# Patient Record
Sex: Male | Born: 1970 | Race: Black or African American | Hispanic: No | Marital: Married | Smoking: Never smoker
Health system: Southern US, Community
[De-identification: ages and names within clinical notes are randomized; demographics above are authoritative.]

---

## 2012-07-29 ENCOUNTER — Ambulatory Visit: Payer: Self-pay | Admitting: Urology

## 2020-09-28 ENCOUNTER — Emergency Department
Admission: EM | Admit: 2020-09-28 | Discharge: 2020-09-28 | Disposition: A | Discharge status: Home / Self Care | Payer: Self-pay | Attending: Emergency Medicine | Admitting: Emergency Medicine

## 2020-09-28 ENCOUNTER — Ambulatory Visit
Admission: EM | Admit: 2020-09-28 | Discharge: 2020-09-28 | Disposition: A | Discharge status: Home / Self Care | Payer: Self-pay | Attending: Emergency Medicine | Admitting: Emergency Medicine

## 2020-09-28 ENCOUNTER — Emergency Department: Payer: Self-pay

## 2020-09-28 ENCOUNTER — Other Ambulatory Visit: Payer: Self-pay

## 2020-09-28 DIAGNOSIS — I639 Cerebral infarction, unspecified: Secondary | ICD-10-CM | POA: Insufficient documentation

## 2020-09-28 DIAGNOSIS — Z20822 Contact with and (suspected) exposure to covid-19: Secondary | ICD-10-CM | POA: Insufficient documentation

## 2020-09-28 DIAGNOSIS — R519 Headache, unspecified: Secondary | ICD-10-CM | POA: Insufficient documentation

## 2020-09-28 DIAGNOSIS — I10 Essential (primary) hypertension: Secondary | ICD-10-CM | POA: Insufficient documentation

## 2020-09-28 LAB — CBC
HCT: 40.5 % (ref 39.0–52.0)
Hemoglobin: 13.8 g/dL (ref 13.0–17.0)
MCH: 31 pg (ref 26.0–34.0)
MCHC: 34.1 g/dL (ref 30.0–36.0)
MCV: 91 fL (ref 80.0–100.0)
Platelets: 293 10*3/uL (ref 150–400)
RBC: 4.45 MIL/uL (ref 4.22–5.81)
RDW: 13.4 % (ref 11.5–15.5)
WBC: 6.8 10*3/uL (ref 4.0–10.5)
nRBC: 0 % (ref 0.0–0.2)

## 2020-09-28 LAB — URINALYSIS, COMPLETE (UACMP) WITH MICROSCOPIC
Bacteria, UA: NONE SEEN
Bacteria, UA: NONE SEEN
Bilirubin Urine: NEGATIVE
Bilirubin Urine: NEGATIVE
Glucose, UA: NEGATIVE mg/dL
Glucose, UA: NEGATIVE mg/dL
Ketones, ur: NEGATIVE mg/dL
Ketones, ur: NEGATIVE mg/dL
Leukocytes,Ua: NEGATIVE
Leukocytes,Ua: NEGATIVE
Nitrite: NEGATIVE
Nitrite: NEGATIVE
Protein, ur: NEGATIVE mg/dL
Protein, ur: NEGATIVE mg/dL
Specific Gravity, Urine: 1.025 (ref 1.005–1.030)
Specific Gravity, Urine: 1.024 (ref 1.005–1.030)
Squamous Epithelial / HPF: NONE SEEN (ref 0–5)
Squamous Epithelial / LPF: NONE SEEN (ref 0–5)
WBC, UA: NONE SEEN WBC/hpf (ref 0–5)
pH: 7 (ref 5.0–8.0)
pH: 5 (ref 5.0–8.0)

## 2020-09-28 LAB — BASIC METABOLIC PANEL
Anion gap: 8 (ref 5–15)
BUN: 17 mg/dL (ref 6–20)
CO2: 27 mmol/L (ref 22–32)
Calcium: 8.9 mg/dL (ref 8.9–10.3)
Chloride: 102 mmol/L (ref 98–111)
Creatinine, Ser: 0.69 mg/dL (ref 0.61–1.24)
GFR, Estimated: >60 mL/min (ref >60)
Glucose, Bld: 126 mg/dL — ABNORMAL HIGH (ref 70–99)
Potassium: 3.7 mmol/L (ref 3.5–5.1)
Sodium: 137 mmol/L (ref 135–145)

## 2020-09-28 LAB — HEPATIC FUNCTION PANEL
ALT: 21 U/L (ref 0–44)
AST: 22 U/L (ref 15–41)
Albumin: 4 g/dL (ref 3.5–5.0)
Alkaline Phosphatase: 54 U/L (ref 38–126)
Bilirubin, Direct: <0.1 mg/dL (ref 0.0–0.2)
Total Bilirubin: 0.9 mg/dL (ref 0.3–1.2)
Total Protein: 7.4 g/dL (ref 6.5–8.1)

## 2020-09-28 LAB — RESP PANEL BY RT-PCR (FLU A&B, COVID) ARPGX2
Influenza A by PCR: NEGATIVE
Influenza B by PCR: NEGATIVE
SARS Coronavirus 2 by RT PCR: NEGATIVE

## 2020-09-28 MED ORDER — PROCHLORPERAZINE EDISYLATE 10 MG/2ML IJ SOLN
10.0000 mg | Freq: Once | INTRAMUSCULAR | Status: AC
  Administered 2020-09-28: 10 mg via INTRAVENOUS
  Filled 2020-09-28: qty 2

## 2020-09-28 MED ORDER — SODIUM CHLORIDE 0.9 % IV BOLUS
1000.0000 mL | Freq: Once | INTRAVENOUS | Status: AC
  Administered 2020-09-28: 1000 mL via INTRAVENOUS

## 2020-09-28 NOTE — ED Notes (Signed)
Pt to MRI at this time.

## 2020-09-28 NOTE — Discharge Instructions (Addendum)
Please follow-up with Dr. Malvin Johns or Dr. Sherryll Burger.  They are the neurologists in town.  They can continue to evaluate you for your headache and dizzy spells.  Please return for any worsening especially if you have focal neurological findings like the sensory changes.  You may have had a complicated or complex migraine.  There headaches that come on with nausea and photophobia and occasionally had numbness or weakness associated with them.

## 2020-09-28 NOTE — ED Provider Notes (Signed)
[facility] Emergency Department Provider Note   ____________________________________________   First MD Initiated Contact with Patient 09/28/20 1244     (approximate)  I have reviewed the triage vital signs and the nursing notes.   HISTORY  Chief Complaint Dizziness, Nausea, and Headache    HPI Joshua Alexander is a 49 y.o. male who reports he has had about 3 days of some slight dizziness or unsteadiness feeling.  He also has had a headache around both eyes and the back of his head it feels tight.  He is not run a fever.  He has not had a cough or chills.  He might have a little bit of sinus stuffiness but no sinus tenderness.  He went to urgent care and they noted a facial droop and some difference in sensation from around one side of his body face arm and leg to the other.  The right side he reports feels heavier.  Patient has a little bit of left facial droop but he reports that his wife looked at him and does not see anything different and his driver's license looks the same as his face does now 2.  Patient reports the headache is worse with bright light.  He has had some nausea with all the symptoms as well.  He had something 2 weeks ago that lasted about 3 days and resolved.  This is his third day of this episode.         History reviewed. No pertinent past medical history.  There are no problems to display for this patient.   History reviewed. No pertinent surgical history.  Prior to Admission medications   Not on File    Allergies Patient has no known allergies.  Family History  Problem Relation Age of Onset  . Hyperlipidemia Mother   . Hypertension Mother   . Diabetes Father   . Hyperlipidemia Father   . Hypertension Father     Social History Social History   Tobacco Use  . Smoking status: Never Smoker  . Smokeless tobacco: Never Used  Substance Use Topics  . Alcohol use: Not Currently  . Drug use: Not on file    Review of  Systems  Constitutional: No fever/chills Eyes:  visual changes. ENT: No sore throat. Cardiovascular: Denies chest pain. Respiratory: Denies shortness of breath. Gastrointestinal: No abdominal pain.  No nausea, no vomiting.  No diarrhea.  No constipation. Genitourinary: Negative for dysuria. Musculoskeletal: Negative for back pain. Skin: Negative for rash. Neurological: Negative for focal weakness  ____________________________________________   PHYSICAL EXAM:  VITAL SIGNS: ED Triage Vitals  Enc Vitals Group     BP 09/28/20 1118 131/77     Pulse Rate 09/28/20 1118 72     Resp 09/28/20 1118 16     Temp 09/28/20 1118 98.1 F (36.7 C)     Temp src --      SpO2 09/28/20 1118 98 %     Weight 09/28/20 1120 151 lb (68.5 kg)     Height 09/28/20 1120 5' 6.5" (1.689 m)     Head Circumference --      Peak Flow --      Pain Score 09/28/20 1120 8     Pain Loc --      Pain Edu? --      Excl. in GC? --     Constitutional: Alert and oriented. Well appearing and in no acute distress. Eyes: Conjunctivae are normal. PERRL. EOMI. fundi appear normal bilaterally Head:  Atraumatic. Nose: No congestion/rhinnorhea. Mouth/Throat: Mucous membranes are moist.  Oropharynx non-erythematous. Neck: No stridor.   Cardiovascular: Normal rate, regular rhythm. Grossly normal heart sounds.  Good peripheral circulation. Respiratory: Normal respiratory effort.  No retractions. Lungs CTAB. Gastrointestinal: Soft and nontender. No distention. No abdominal bruits. No CVA tenderness. Musculoskeletal: No lower extremity tenderness nor edema.   Neurologic:  Normal speech and language.  Cranial nerves II through XII are intact of the visual fields were not checked cerebellar finger-to-nose rapid alternating movements and hands are normal toe to my hand is normal motor strength is 5/5 throughout patient does report that the right side of his body feels heavier than the left side when I check light touch.  There is  applies to the face arm and leg. Skin:  Skin is warm, dry and intact. No rash noted. Psychiatric: Mood and affect are normal. Speech and behavior are normal.  ____________________________________________   LABS (all labs ordered are listed, but only abnormal results are displayed)  Labs Reviewed  BASIC METABOLIC PANEL - Abnormal; Notable for the following components:      Result Value   Glucose, Bld 126 (*)    All other components within normal limits  URINALYSIS, COMPLETE (UACMP) WITH MICROSCOPIC - Abnormal; Notable for the following components:   Color, Urine YELLOW (*)    APPearance CLEAR (*)    Hgb urine dipstick MODERATE (*)    All other components within normal limits  RESP PANEL BY RT-PCR (FLU A&B, COVID) ARPGX2  CBC  HEPATIC FUNCTION PANEL  CBG MONITORING, ED   ____________________________________________  EKG  EKG read interpreted by me shows normal sinus rhythm rate of 80 normal axis no acute ST-T wave changes.  Computer is same consider left ventricular hypertrophy. ____________________________________________  RADIOLOGY Jill Poling, personally viewed and evaluated these images (plain radiographs) as part of my medical decision making, as well as reviewing the written report by the radiologist.  ED MD interpretation:    Official radiology report(s): CT Head Wo Contrast  Result Date: 09/28/2020 CLINICAL DATA:  Dizziness, nonspecific; headache, classic migraine; headache for 3 days a second episode behind the eyes behind the back of the head with some dizziness. EXAM: CT HEAD WITHOUT CONTRAST TECHNIQUE: Contiguous axial images were obtained from the base of the skull through the vertex without intravenous contrast. COMPARISON:  No pertinent prior exams are available for comparison. FINDINGS: Brain: Cerebral volume is normal. There is no acute intracranial hemorrhage. No demarcated cortical infarct. No extra-axial fluid collection. No evidence of intracranial  mass. No midline shift. Vascular: No hyperdense vessel. Skull: No calvarial fracture. Benign appearing 13 mm well-circumscribed low-density focus within the right sphenoid bone, posterior to the right sphenoid sinus. Sinuses/Orbits: Visualized orbits show no acute finding. Extensive partial opacification of the bilateral ethmoid air cells. Frothy secretions within the right frontal and maxillary sinuses. IMPRESSION: No evidence of acute intracranial abnormality. Ethmoid, right frontal and right maxillary sinusitis. Electronically Signed   By: Jackey Loge DO   On: 09/28/2020 13:52   MR BRAIN WO CONTRAST  Result Date: 09/28/2020 CLINICAL DATA:  Dizziness EXAM: MRI HEAD WITHOUT CONTRAST TECHNIQUE: Multiplanar, multiecho pulse sequences of the brain and surrounding structures were obtained without intravenous contrast. COMPARISON:  None. FINDINGS: Brain: There is no acute infarction or intracranial hemorrhage. There is no intracranial mass, mass effect, or edema. There is no hydrocephalus or extra-axial fluid collection. Ventricles and sulci are normal in size and configuration. Vascular: Major vessel flow voids at  the skull base are preserved. Skull and upper cervical spine: Normal marrow signal is preserved. Sinuses/Orbits: Paranasal sinus inflammatory changes, greatest in the ethmoids. Orbits are unremarkable. Other: Sella is unremarkable.  Mastoid air cells are clear. IMPRESSION: No evidence of recent infarction, hemorrhage, or mass. Nonspecific paranasal sinus inflammatory changes. Electronically Signed   By: Guadlupe Spanish M.D.   On: 09/28/2020 16:37   DG Chest Portable 1 View  Result Date: 09/28/2020 CLINICAL DATA:  Possible stroke like symptoms. History diabetes, hypertension. EXAM: PORTABLE CHEST 1 VIEW COMPARISON:  No pertinent prior exams are available for comparison. FINDINGS: Heart size within normal limits. No appreciable airspace consolidation or pulmonary edema. No evidence of pleural effusion  or pneumothorax. No acute bony abnormality is identified. IMPRESSION: No evidence of acute cardiopulmonary abnormality. Electronically Signed   By: Jackey Loge DO   On: 09/28/2020 13:53    ____________________________________________   PROCEDURES  Procedure(s) performed (including Critical Care):  Procedures   ____________________________________________   INITIAL IMPRESSION / ASSESSMENT AND PLAN / ED COURSE  Patient with negative CT negative MRI symptoms of numbness or different touch on the right side resolved with Compazine and fluids.  Patient's headache improved.  Patient did have some fluid in his sinuses but no fever no real sinus tenderness and no white count.  This may be due to a viral infection.  Again patient's headaches and all of his symptoms resolved completely.  He wants to go home.  I will let him do so.  I did tell him about the sinus infection.  He will follow up with neurology for possible complex migraine.  With symptoms for about 3 days and nothing on MRI etc. and resolution with Compazine and fluids it is unlikely that this was a TIA/CVA.  After several more days if his headache returns we could treat his sinusitis with antibiotics.  We can also do that if he develops a fever but currently I would not as he does not meet the criteria at this point.             ____________________________________________   FINAL CLINICAL IMPRESSION(S) / ED DIAGNOSES  Final diagnoses:  Nonintractable headache, unspecified chronicity pattern, unspecified headache type     ED Discharge Orders    None      *Please note:  Joshua Alexander was evaluated in Emergency Department on 09/28/2020 for the symptoms described in the history of present illness. He was evaluated in the context of the global COVID-19 pandemic, which necessitated consideration that the patient might be at risk for infection with the SARS-CoV-2 virus that causes COVID-19. Institutional protocols and  algorithms that pertain to the evaluation of patients at risk for COVID-19 are in a state of rapid change based on information released by regulatory bodies including the CDC and federal and state organizations. These policies and algorithms were followed during the patient's care in the ED.  Some ED evaluations and interventions may be delayed as a result of limited staffing during and the pandemic.*   Note:  This document was prepared using Dragon voice recognition software and may include unintentional dictation errors.    Arnaldo Natal, MD 09/28/20 2050

## 2020-09-28 NOTE — ED Notes (Signed)
Pt verbalized understanding of d/c instructions at this time. Pt denies questions at this time

## 2020-09-28 NOTE — Discharge Instructions (Addendum)
As per our conversation with your new headache, dizziness, and elevated blood pressure combined with your sensory changes to the left side of your face I am recommending that she go to the ER for evaluation.

## 2020-09-28 NOTE — ED Notes (Addendum)
This RN to bedside at this time. Pt states he was seen at a clinic today, and they said "my BP was through the roof" Pt also states the staff at the clinic noticed a slight facial droop and told him he needed a CT scan.   NIH completed by this RN, pt scored 0 at this time.   Pt NAD, A&Ox 4 at this time.

## 2020-09-28 NOTE — ED Triage Notes (Signed)
Pt is here with a migraine and dizziness with nausea that started 3 days ago, pt has taken Tylenol and Advil to relieve discomfort.

## 2020-09-28 NOTE — ED Triage Notes (Signed)
Pt arrives via pov. C/o headache, dizziness, nausea w/o emesis, sensitivity to light x 3 days. Pt reports having similar occurrence 2 weeks ago with same symptoms that resolved after 3 day. Pt ambulatory to triage with steady gate, a&o x4, NAD noted at this time. Pupils equal round reactive.

## 2020-09-28 NOTE — ED Provider Notes (Signed)
[facility]    CSN: [id number] Arrival date & time: 09/28/20  0909      History   Chief Complaint Chief Complaint  Patient presents with  . Dizziness  . Migraine  . Nausea    HPI Joshua Alexander is a 49 y.o. male.   HPI   49 year old male here for evaluation of headache, dizziness, and nausea.  Patient reports that his symptoms started 3 days ago.  His headache is a tight band around his forehead and to the back base of his skull.  Patient states that he had similar symptoms 2 weeks ago.  He states that he woke up and when he went to get out of bed he immediately fell due to being dizzy.  He states he had a headache at that time, he was flushed, and it took 3 days to resolve.  Patient states this time that his headache started 3 days ago but the dizziness started yesterday while he was on the job site.  He states he was climbing a ladder and fell off due to dizziness.  Patient denies hitting his head or loss of consciousness.  Patient denies flashing lights or spots, numbness, tingling, weakness.  Patient has a history of vertigo but states that this is not like his vertigo it is more of a feeling of being off balance.  Patient is also light sensitive and noise sensitive.  Patient denies ringing in his ears.  Patient states that he has not had any visual changes except for he notices that he has to use his reading glasses more often.  When he does uses reading glasses his headaches and his eyestrain ease up.  Patient has never seen an eye doctor.  History reviewed. No pertinent past medical history.  There are no problems to display for this patient.   History reviewed. No pertinent surgical history.     Home Medications    Prior to Admission medications   Not on File    Family History Family History  Problem Relation Age of Onset  . Hyperlipidemia Mother   . Hypertension Mother   . Diabetes Father   . Hyperlipidemia Father   . Hypertension Father      Social History Social History   Tobacco Use  . Smoking status: Never Smoker  . Smokeless tobacco: Never Used  Substance Use Topics  . Alcohol use: Not Currently  . Drug use: Not on file     Allergies   Patient has no known allergies.   Review of Systems Review of Systems  Constitutional: Negative for appetite change and fever.  HENT: Negative for congestion, rhinorrhea and sore throat.   Respiratory: Negative for cough, shortness of breath and wheezing.   Cardiovascular: Negative for chest pain.  Gastrointestinal: Positive for nausea.  Musculoskeletal: Negative for arthralgias and myalgias.  Skin: Negative for rash.  Neurological: Positive for dizziness and headaches. Negative for syncope, speech difficulty, weakness and numbness.  Hematological: Negative.   Psychiatric/Behavioral: Negative.      Physical Exam Triage Vital Signs ED Triage Vitals  Enc Vitals Group     BP 09/28/20 0930 (!) 142/107     Pulse Rate 09/28/20 0930 81     Resp 09/28/20 0930 18     Temp 09/28/20 0930 98.1 F (36.7 C)     Temp Source 09/28/20 0930 Oral     SpO2 09/28/20 0930 98 %     Weight --      Height --  Head Circumference --      Peak Flow --      Pain Score 09/28/20 0928 6     Pain Loc --      Pain Edu? --      Excl. in GC? --    No data found.  Updated Vital Signs BP (!) 142/107 (BP Location: Left Arm)   Pulse 81   Temp 98.1 F (36.7 C) (Oral)   Resp 18   SpO2 98%   Visual Acuity Right Eye Distance:   Left Eye Distance:   Bilateral Distance:    Right Eye Near:   Left Eye Near:    Bilateral Near:     Physical Exam Vitals and nursing note reviewed.  Constitutional:      Appearance: He is normal weight. He is not toxic-appearing or diaphoretic.  HENT:     Head: Normocephalic.     Right Ear: Tympanic membrane, ear canal and external ear normal. There is no impacted cerumen.     Left Ear: Tympanic membrane, ear canal and external ear normal. There is no  impacted cerumen.  Eyes:     General: No scleral icterus.    Conjunctiva/sclera: Conjunctivae normal.     Pupils: Pupils are equal, round, and reactive to light.  Neck:     Vascular: No carotid bruit.  Cardiovascular:     Rate and Rhythm: Normal rate and regular rhythm.     Pulses: Normal pulses.     Heart sounds: Normal heart sounds. No murmur heard.  No gallop.   Pulmonary:     Effort: Pulmonary effort is normal.     Breath sounds: Normal breath sounds. No wheezing, rhonchi or rales.  Musculoskeletal:        General: No swelling or tenderness. Normal range of motion.     Cervical back: Normal range of motion and neck supple. No tenderness.  Skin:    General: Skin is warm and dry.     Capillary Refill: Capillary refill takes less than 2 seconds.     Findings: No erythema.  Neurological:     Mental Status: He is alert and oriented to person, place, and time.     Sensory: Sensory deficit present.     Motor: No weakness.     Coordination: Coordination normal.     Deep Tendon Reflexes: Reflexes normal.  Psychiatric:        Mood and Affect: Mood normal.        Behavior: Behavior normal.        Thought Content: Thought content normal.        Judgment: Judgment normal.      UC Treatments / Results  Labs (all labs ordered are listed, but only abnormal results are displayed) Labs Reviewed  URINALYSIS, COMPLETE (UACMP) WITH MICROSCOPIC - Abnormal; Notable for the following components:      Result Value   APPearance HAZY (*)    Hgb urine dipstick SMALL (*)    All other components within normal limits    EKG   Radiology No results found.  Procedures Procedures (including critical care time)  Medications Ordered in UC Medications - No data to display  Initial Impression / Assessment and Plan / UC Course  I have reviewed the triage vital signs and the nursing notes.  Pertinent labs & imaging results that were available during my care of the patient were reviewed by  me and considered in my medical decision making (see chart for details).   Patient  is here for evaluation of headache with associated dizziness and nausea x3 days. Patient has had 2 similar episodes back to back in the past 2 weeks. Patient has a history of vertigo in the past but this is unlike his previous vertigo symptoms. Patient also has no documented history of hypertension but he is hypertensive at 142/107 here. On exam patient has sensory deficits to the left side of his face and questionable asymmetry that was mild on the left side of his face. Given the setting of new onset headache, dizziness, sensory deficits and advising patient to go to the ER for evaluation CT scan as we are unable to perform CT scan here today. 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 She has no smoking, alcohol use, or drug use history. She does have a family history of hypertension.   Final Clinical Impressions(s) / UC Diagnoses   Final diagnoses:  Acute nonintractable headache, unspecified headache type  Hypertension, unspecified type     Discharge Instructions     As per our conversation with your new headache, dizziness, and elevated blood pressure combined with your sensory changes to the left side of your face I am recommending that she go to the ER for evaluation.    ED Prescriptions    None     PDMP not reviewed this encounter.   Becky Augusta, NP 09/28/20 1023

## 2022-06-02 IMAGING — CT CT HEAD W/O CM
3 series · 16 of 47 positions shown, 19 images · non-contrast
Comparison: No pertinent prior exams are available for comparison.

CLINICAL DATA: Dizziness, nonspecific; headache, classic migraine;
headache for 3 days a second episode behind the eyes behind the back
of the head with some dizziness.

EXAM:
CT HEAD WITHOUT CONTRAST
TECHNIQUE: Contiguous axial images were obtained from the base of the skull
through the vertex without intravenous contrast.

[Series 2: head wo · axial · 0.44mm/px · z∈[-153,-23]mm · 10 of 32 slices shown, 13 images]
[im 3/32  brain]
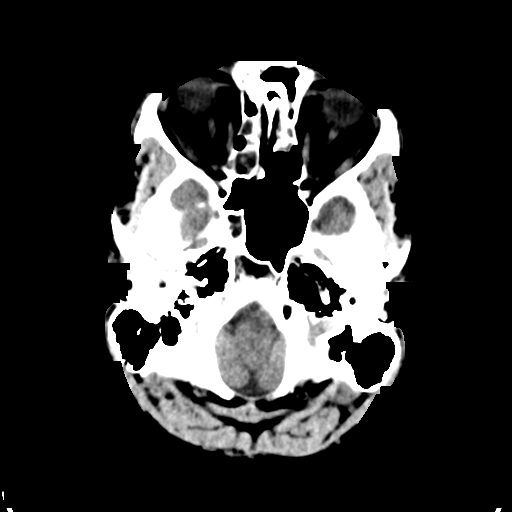
[im 3/32  bone]
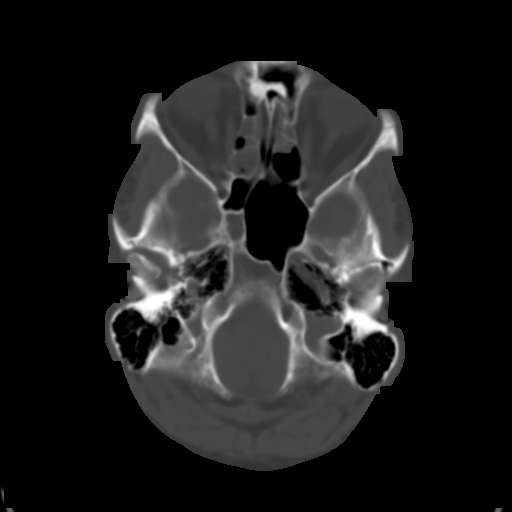
[im 6/32  brain]
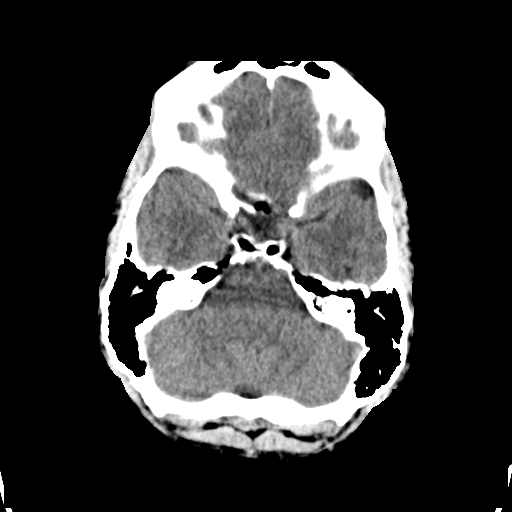
[im 9/32  brain]
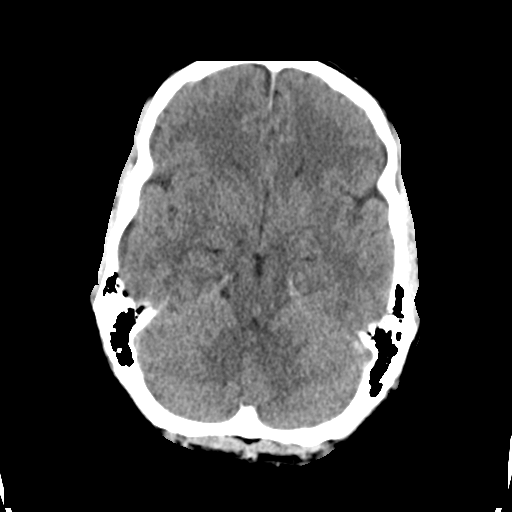
[im 11/32  brain]
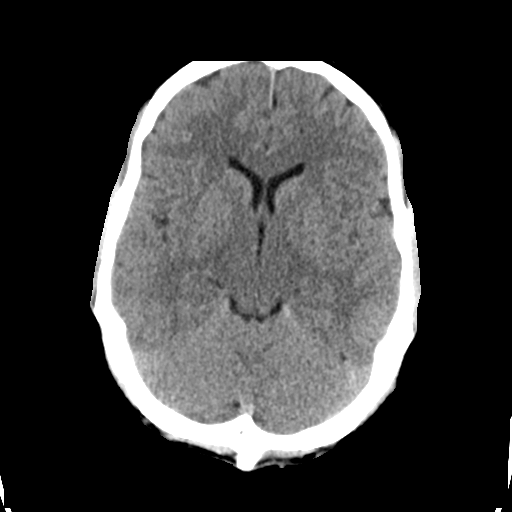
[im 14/32  brain]
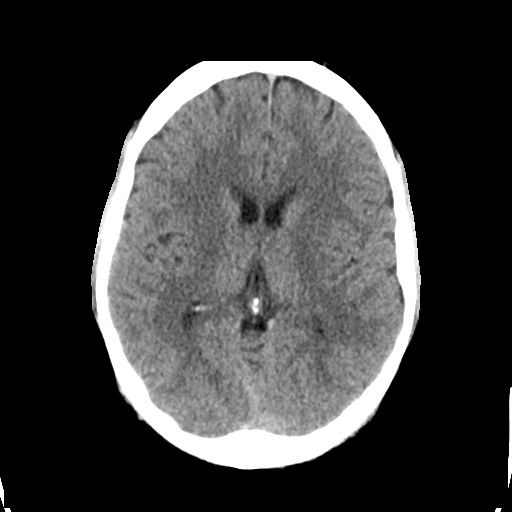
[im 14/32  bone]
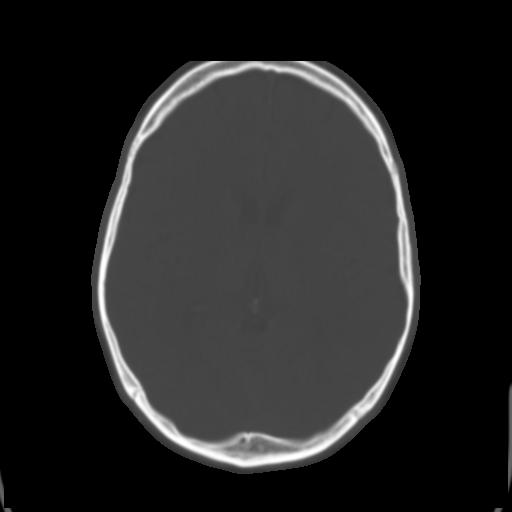
[im 18/32  brain]
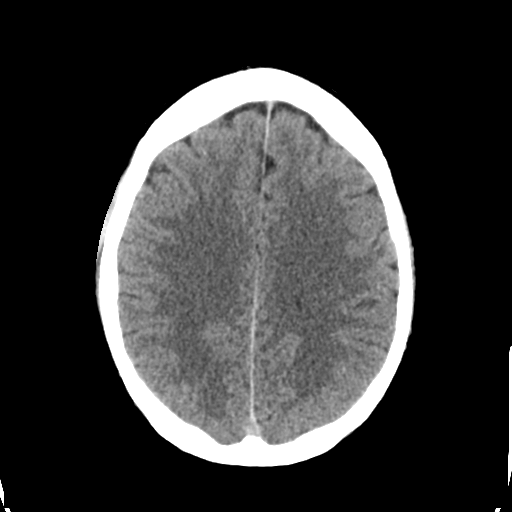
[im 21/32  brain]
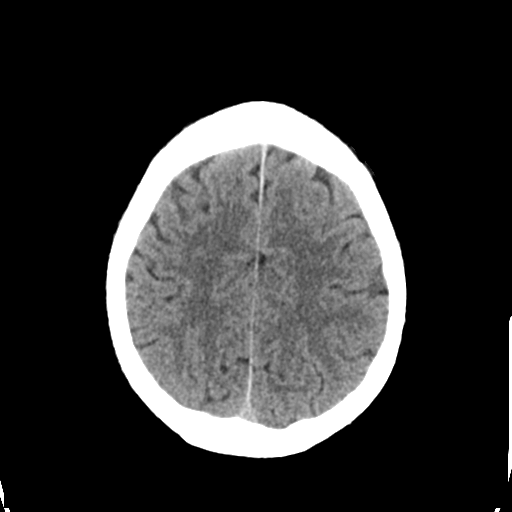
[im 24/32  brain]
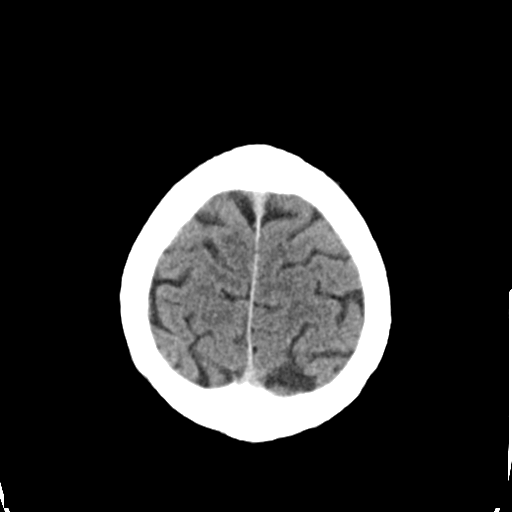
[im 26/32  brain]
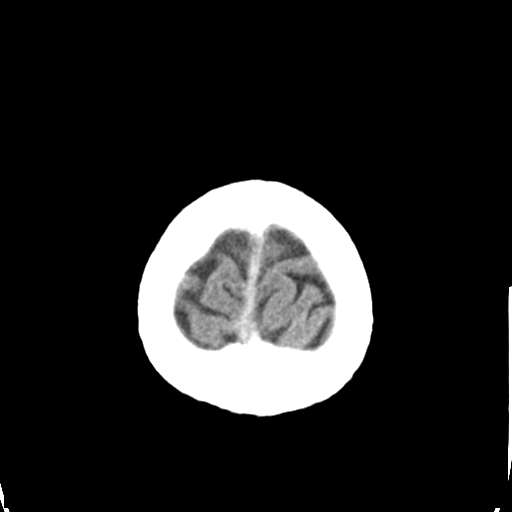
[im 26/32  bone]
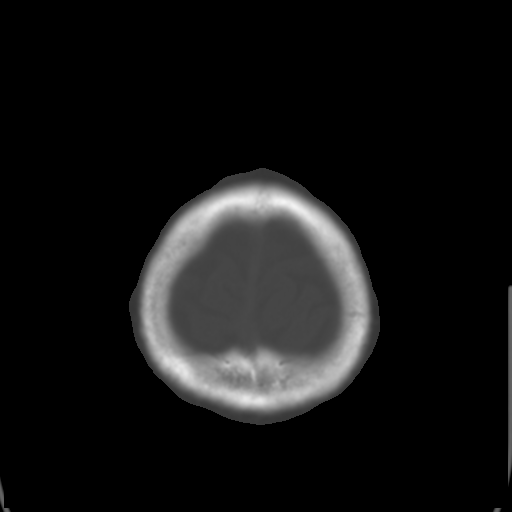
[im 29/32  brain]
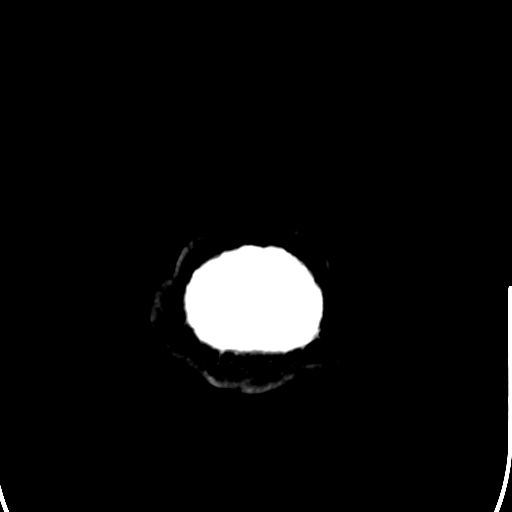

[Series 4: coronal soft tissue · coronal · 0.33mm/px · 3 of 67 slices shown]
[im 23/67  brain]
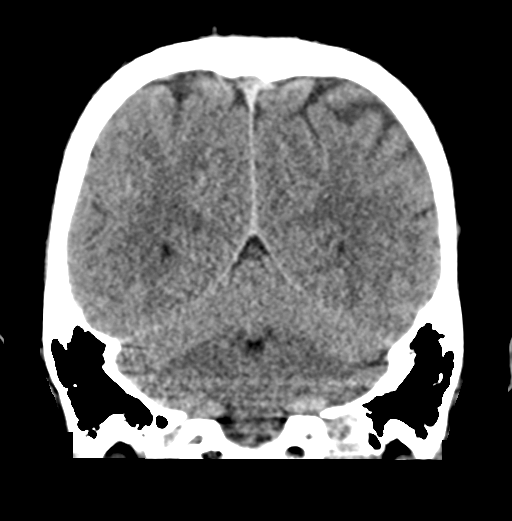
[im 30/67  brain]
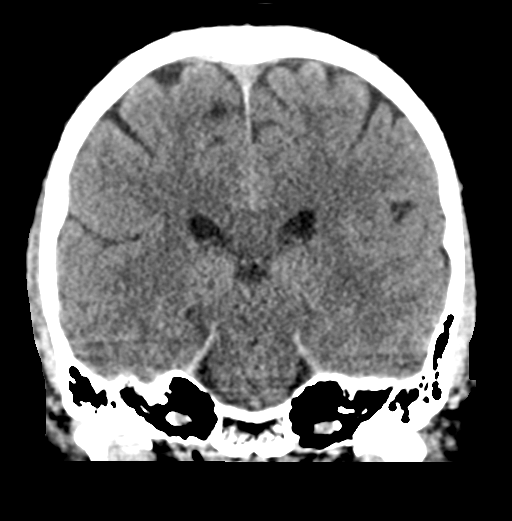
[im 37/67  brain]
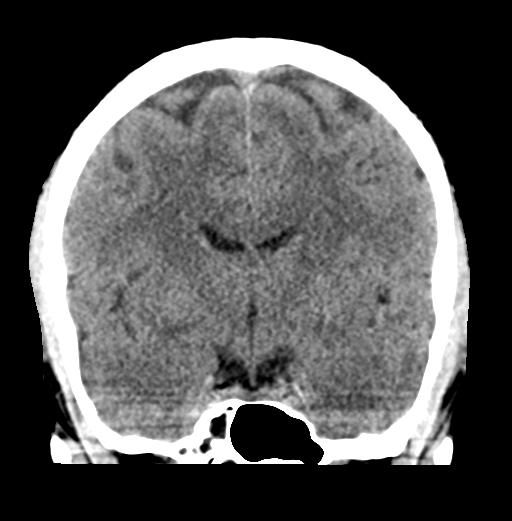

[Series 5: sagittal soft tissue · sagittal · 0.33mm/px · 3 of 57 slices shown]
[im 19/57  brain]
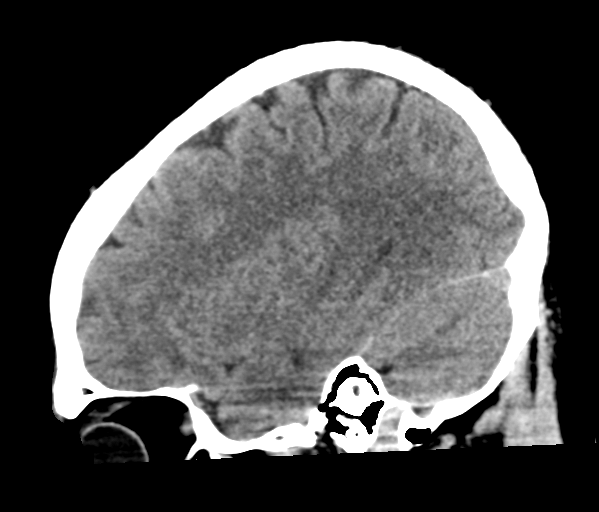
[im 29/57  brain]
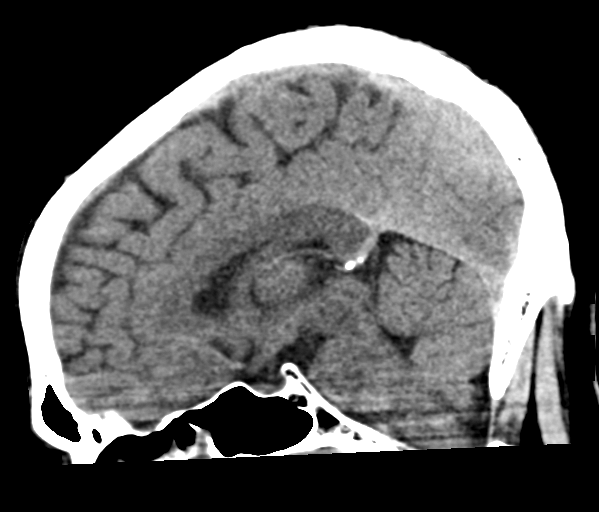
[im 38/57  brain]
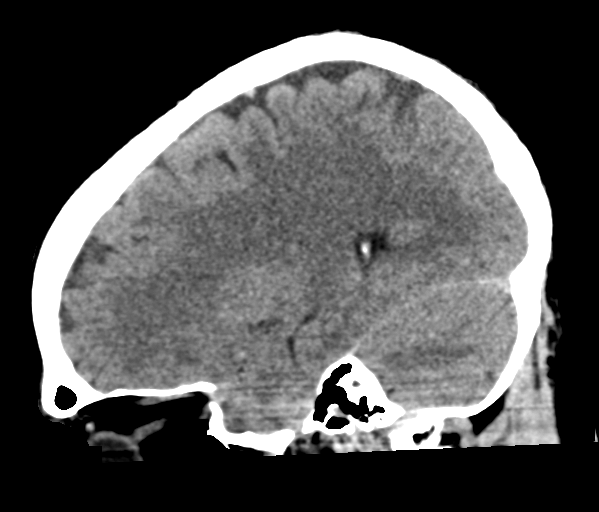

[16 of 47 positions shown; findings below may reference images not displayed]

FINDINGS: Brain:

Cerebral volume is normal.

There is no acute intracranial hemorrhage.

No demarcated cortical infarct.

No extra-axial fluid collection.

No evidence of intracranial mass.

No midline shift.

Vascular: No hyperdense vessel.

Skull: No calvarial fracture. Benign appearing 13 mm
well-circumscribed low-density focus within the right sphenoid bone,
posterior to the right sphenoid sinus.

Sinuses/Orbits: Visualized orbits show no acute finding. Extensive
partial opacification of the bilateral ethmoid air cells. Frothy
secretions within the right frontal and maxillary sinuses.
IMPRESSION: No evidence of acute intracranial abnormality.

Ethmoid, right frontal and right maxillary sinusitis.

## 2023-11-28 ENCOUNTER — Ambulatory Visit
Admission: EM | Admit: 2023-11-28 | Discharge: 2023-11-28 | Disposition: A | Discharge status: Home / Self Care | Payer: Self-pay | Attending: Physician Assistant | Admitting: Physician Assistant

## 2023-11-28 ENCOUNTER — Ambulatory Visit (INDEPENDENT_AMBULATORY_CARE_PROVIDER_SITE_OTHER): Payer: Self-pay

## 2023-11-28 DIAGNOSIS — M7989 Other specified soft tissue disorders: Secondary | ICD-10-CM

## 2023-11-28 DIAGNOSIS — M79602 Pain in left arm: Secondary | ICD-10-CM

## 2023-11-28 DIAGNOSIS — S63502A Unspecified sprain of left wrist, initial encounter: Secondary | ICD-10-CM

## 2023-11-28 DIAGNOSIS — S60459A Superficial foreign body of unspecified finger, initial encounter: Secondary | ICD-10-CM

## 2023-11-28 DIAGNOSIS — S5012XA Contusion of left forearm, initial encounter: Secondary | ICD-10-CM

## 2023-11-28 DIAGNOSIS — M25532 Pain in left wrist: Secondary | ICD-10-CM

## 2023-11-28 NOTE — Discharge Instructions (Addendum)
-  Negative for fractures.  You do have a splinter in your finger so soak the finger in warm Epsom salts a couple times a day and gently press on the area.  Your body will likely push the splinter out.  If you notice any signs of infection return for reevaluation. - You have a large hematoma of your forearm.  Ice this area every couple of hours and elevate it. - Use the sling and brace as needed for comfort. - Take ibuprofen and or Tylenol for pain relief. - Avoid painful activities.

## 2023-11-28 NOTE — ED Provider Notes (Signed)
[facility]    CSN: [id number] Arrival date & time: 11/28/23  0803      History   Chief Complaint Chief Complaint  Patient presents with   Arm Pain         HPI Joshua Alexander is a 53 y.o. male who is right-hand dominant.  He presents today for a left upper extremity injury after slipping and falling on ice while unloading a truck.  States that he stuck his hand out to try to catch himself.  Patient states he has most pain and swelling of the forearm but also has pain in the wrist and parts of his hand.  Reports difficulty making a fist due to pain and discomfort.  He has applied ice and taken a gram of Tylenol last night.  Denies numbness, weakness or tingling.  No pain in his shoulder or elbow.  Denies head injury or loss conscious.  No other injuries to report.  HPI  History reviewed. No pertinent past medical history.  There are no active problems to display for this patient.   History reviewed. No pertinent surgical history.     Home Medications    Prior to Admission medications   Not on File    Family History Family History  Problem Relation Age of Onset   Hyperlipidemia Mother    Hypertension Mother    Diabetes Father    Hyperlipidemia Father    Hypertension Father     Social History Social History   Tobacco Use   Smoking status: Never   Smokeless tobacco: Never  Vaping Use   Vaping status: Never Used  Substance Use Topics   Alcohol use: Not Currently   Drug use: Never     Allergies   Patient has no known allergies.   Review of Systems Review of Systems  Musculoskeletal:  Positive for arthralgias. Negative for joint swelling, neck pain and neck stiffness.  Skin:  Negative for color change and wound.  Neurological:  Negative for syncope, weakness, numbness and headaches.     Physical Exam Triage Vital Signs ED Triage Vitals  Encounter Vitals Group     BP      Systolic BP Percentile      Diastolic BP Percentile       Pulse      Resp      Temp      Temp src      SpO2      Weight      Height      Head Circumference      Peak Flow      Pain Score      Pain Loc      Pain Education      Exclude from Growth Chart    No data found.  Updated Vital Signs BP 131/74 (BP Location: Left Arm)   Pulse 64   Temp 98.7 F (37.1 C) (Oral)   Ht 5\' 7"  (1.702 m)   Wt 162 lb (73.5 kg)   SpO2 96%   BMI 25.37 kg/m    Physical Exam Vitals and nursing note reviewed.  Constitutional:      General: He is not in acute distress.    Appearance: Normal appearance. He is well-developed. He is not ill-appearing.  HENT:     Head: Normocephalic and atraumatic.  Eyes:     General: No scleral icterus.    Conjunctiva/sclera: Conjunctivae normal.  Cardiovascular:     Rate and Rhythm: Normal rate and regular  rhythm.  Pulmonary:     Effort: Pulmonary effort is normal. No respiratory distress.     Breath sounds: Normal breath sounds.  Musculoskeletal:     Cervical back: Neck supple.     Comments: LEFT UPPER EXT: Hematoma/swelling present over forearm dorsally.  Tenderness diffusely throughout wrist and forearm and reduced range of motion of wrist.  No tenderness of elbow and full range of motion of elbow.  Tenderness at base of thumb and about the fourth metacarpal.  No swelling or bruising noted of hand.  No wounds.  Good pulses.  Skin:    General: Skin is warm and dry.     Capillary Refill: Capillary refill takes less than 2 seconds.  Neurological:     General: No focal deficit present.     Mental Status: He is alert. Mental status is at baseline.     Motor: No weakness.     Gait: Gait normal.  Psychiatric:        Mood and Affect: Mood normal.        Behavior: Behavior normal.      UC Treatments / Results  Labs (all labs ordered are listed, but only abnormal results are displayed) Labs Reviewed - No data to display  EKG   Radiology DG Forearm Left Result Date: 11/28/2023 CLINICAL DATA:  53 year old  male with history of trauma from a fall. Left hand and arm pain. EXAM: LEFT FOREARM - 2 VIEW COMPARISON:  No priors. FINDINGS: There is no evidence of fracture or other focal bone lesions. Soft tissues are unremarkable. IMPRESSION: Negative. Electronically Signed   By: Trudie Reed M.D.   On: 11/28/2023 08:44   DG Hand Complete Left Result Date: 11/28/2023 CLINICAL DATA:  53 year old male with history of trauma from a fall on ice. Pain in the left hand. EXAM: LEFT HAND - COMPLETE 3+ VIEW COMPARISON:  No priors. FINDINGS: Three views of the left hand demonstrate no acute displaced fracture, subluxation or dislocation. Small radiopaque linear foreign body in the soft tissues of the volar aspect of the fourth digit adjacent to the middle phalanx. Extensive soft tissue swelling adjacent to the medial aspect of the middle phalanx of the third digit (no prior studies are available for comparison). IMPRESSION: 1. No acute displaced fracture or dislocation. 2. Small radiopaque linear foreign body in the soft tissues of the volar aspect of the fourth digit adjacent to the middle phalanx. 3. Extensive soft tissue swelling adjacent to the medial aspect of the middle phalanx of the third digit. Electronically Signed   By: Trudie Reed M.D.   On: 11/28/2023 08:44    Procedures Procedures (including critical care time)  Medications Ordered in UC Medications - No data to display  Initial Impression / Assessment and Plan / UC Course  I have reviewed the triage vital signs and the nursing notes.  Pertinent labs & imaging results that were available during my care of the patient were reviewed by me and considered in my medical decision making (see chart for details).    53 year old male presents for pain of the left forearm and hand following accidental slip and fall on ice.  Reports icing the area and taking Tylenol.  Exam findings--LEFT UPPER EXT: Hematoma/swelling present over forearm dorsally.   Tenderness diffusely throughout wrist and forearm and reduced range of motion of wrist.  No tenderness of elbow and full range of motion of elbow.  Tenderness at base of thumb and about the fourth metacarpal.  No swelling  or bruising noted of hand.  No wounds.  Good pulses.  X-rays obtained of left hand and forearm to assess for potential fracture.  X-rays are all negative for fracture but he does have a linear foreign body of the fourth finger.  Patient reports sustaining a splinter a couple weeks ago.  Able to see a dark spot under the skin which appears to be consistent with splinter.  Patient given thumb spica wrist brace and sling for comfort.  Advised ibuprofen and Tylenol, cryotherapy.  Discussed care of skin foreign body.  Encouraged him to use warm Epsom salt soaks and gently press on the area as his body will likely push the splinter out.  Advised to return for any signs of infection.   Final Clinical Impressions(s) / UC Diagnoses   Final diagnoses:  Pain and swelling of left upper extremity  Hematoma of left forearm  Left wrist pain  Splinter of finger  Sprain of left wrist, initial encounter     Discharge Instructions      -Negative for fractures.  You do have a splinter in your finger so soak the finger in warm Epsom salts a couple times a day and gently press on the area.  Your body will likely push the splinter out.  If you notice any signs of infection return for reevaluation. - You have a large hematoma of your forearm.  Ice this area every couple of hours and elevate it. - Use the sling and brace as needed for comfort. - Take ibuprofen and or Tylenol for pain relief. - Avoid painful activities.     ED Prescriptions   None    PDMP not reviewed this encounter.   Shirlee Latch, PA-C 11/28/23 [id number]

## 2023-11-28 NOTE — ED Triage Notes (Signed)
Pt c/o left arm injury x1day  Pt states that he was unloading a work truck and slipped onto ice and landed on his left arm.   Pt states that he was trying to catch himself and landed on his wrist.   Pt has swelling in his mid forearm and states that the pain is from mid forearm down to wrist.  Pt states that he can not make a fist  Pt denies pain in elbow, hand, or fingers.

## 2024-09-05 ENCOUNTER — Encounter: Payer: Self-pay | Admitting: Emergency Medicine

## 2024-09-05 ENCOUNTER — Ambulatory Visit
Admission: EM | Admit: 2024-09-05 | Discharge: 2024-09-05 | Disposition: A | Discharge status: Home / Self Care | Payer: Self-pay | Attending: Emergency Medicine | Admitting: Emergency Medicine

## 2024-09-05 DIAGNOSIS — M7631 Iliotibial band syndrome, right leg: Secondary | ICD-10-CM

## 2024-09-05 MED ORDER — KETOROLAC TROMETHAMINE 30 MG/ML IJ SOLN
30.0000 mg | Freq: Once | INTRAMUSCULAR | Status: AC
  Administered 2024-09-05: 30 mg via INTRAMUSCULAR

## 2024-09-05 MED ORDER — IBUPROFEN 600 MG PO TABS: 600.0000 mg | ORAL_TABLET | Freq: Four times a day (QID) | ORAL | 0 refills | Status: AC | PRN

## 2024-09-05 NOTE — ED Triage Notes (Signed)
 Pt presents with right hip pain that radiates down his leg x 4 days The pain started after a bump appeared on his right thigh and right ankle. Pt is taking tylenol for the pain.

## 2024-09-05 NOTE — ED Provider Notes (Signed)
[facility]    CSN: [id number] Arrival date & time: 09/05/24  1307      History   Chief Complaint Chief Complaint  Patient presents with   Hip Pain    HPI Joshua Alexander is a 53 y.o. male.   HPI  53 year old male with no significant past medical history presents for evaluation of pain in his right leg that extends from his hip down to his knee and occasionally will radiate down the outside of his lower leg to his ankle.  No injury or falls.  He has been using Tylenol with minimal relief of pain.  History reviewed. No pertinent past medical history.  There are no active problems to display for this patient.   History reviewed. No pertinent surgical history.     Home Medications    Prior to Admission medications   Medication Sig Start Date End Date Taking? Authorizing Provider  ibuprofen (ADVIL) 600 MG tablet Take 1 tablet (600 mg total) by mouth every 6 (six) hours as needed. 09/05/24  Yes Bernardino Ditch, NP    Family History Family History  Problem Relation Age of Onset   Hyperlipidemia Mother    Hypertension Mother    Diabetes Father    Hyperlipidemia Father    Hypertension Father     Social History Social History   Tobacco Use   Smoking status: Never   Smokeless tobacco: Never  Vaping Use   Vaping status: Never Used  Substance Use Topics   Alcohol use: Not Currently   Drug use: Never     Allergies   Patient has no known allergies.   Review of Systems Review of Systems  Musculoskeletal:  Positive for myalgias. Negative for arthralgias and joint swelling.  Skin:  Negative for color change and wound.     Physical Exam Triage Vital Signs ED Triage Vitals  Encounter Vitals Group     BP      Girls Systolic BP Percentile      Girls Diastolic BP Percentile      Boys Systolic BP Percentile      Boys Diastolic BP Percentile      Pulse      Resp      Temp      Temp src      SpO2      Weight      Height      Head  Circumference      Peak Flow      Pain Score      Pain Loc      Pain Education      Exclude from Growth Chart    No data found.  Updated Vital Signs BP 135/73 (BP Location: Right Arm)   Pulse 78   Temp 98.8 F (37.1 C) (Oral)   Resp 16   Wt 155 lb (70.3 kg)   SpO2 95%   BMI 24.28 kg/m   Visual Acuity Right Eye Distance:   Left Eye Distance:   Bilateral Distance:    Right Eye Near:   Left Eye Near:    Bilateral Near:     Physical Exam Vitals and nursing note reviewed.  Constitutional:      Appearance: Normal appearance. He is not ill-appearing.  HENT:     Head: Normocephalic and atraumatic.  Musculoskeletal:        General: Tenderness present. No swelling or signs of injury.  Skin:    General: Skin is warm and dry.  Capillary Refill: Capillary refill takes less than 2 seconds.     Findings: Lesion present. No erythema.  Neurological:     General: No focal deficit present.     Mental Status: He is alert and oriented to person, place, and time.      UC Treatments / Results  Labs (all labs ordered are listed, but only abnormal results are displayed) Labs Reviewed - No data to display  EKG   Radiology No results found.  Procedures Procedures (including critical care time)  Medications Ordered in UC Medications  ketorolac (TORADOL) 30 MG/ML injection 30 mg (has no administration in time range)    Initial Impression / Assessment and Plan / UC Course  I have reviewed the triage vital signs and the nursing notes.  Pertinent labs & imaging results that were available during my care of the patient were reviewed by me and considered in my medical decision making (see chart for details).   Patient is a nontoxic-appearing 53 year old male presenting for evaluation of pain in the right leg as outlined in the HPI above.  He reports that he noticed 2 small lesions, 1 on the inside of his right ankle, the other on the posterior aspect of his right thigh, just  before the onset of pain.  No injury.    Patient seen and developed, the lesion a pop on the back of the patient's right thigh is actually a mole.  The other lesion on the inside ankle appears to be a resolving ingrown hair or pimple of some kind.  I do not think that these are causing the patient's pain.  He does have pain with palpation of his IT band.  His symptoms are most consistent with IT band syndrome.  He has been using Tylenol without any improvement of symptoms.  I will have staff administer 30 mg of IM Toradol in clinic prior to discharge and then discharge him home on 600 mg ibuprofen that he can take every 6 hours.  I will also give him home physical therapy exercises to perform.  I have advised the patient that if he does not have any improvement of his symptoms in the next 5 to 7 days that he should follow-up with orthopedics.    Final Clinical Impressions(s) / UC Diagnoses   Final diagnoses:  It band syndrome, right     Discharge Instructions      Rest your right leg is much as possible.  Avoid activities that trigger the pain.  If you exercise routinely I suggest reducing your intensity of training.  Apply ice to the affected area for 10 to 15 minutes, 2-3 times a day to help decrease pain and inflammation.  Ice for the first 48 hours and then follow icing by moist heat.  Take the 600 mg of ibuprofen every 6 hours with food to help with pain and inflammation.  Follow the home physical therapy exercises provided in your discharge instructions.  If you do not have any improvement of your symptoms in the next 5 to 7 days I recommend that you follow-up with orthopedics as you may need dedicated physical therapy or other therapeutic interventions.     ED Prescriptions     Medication Sig Dispense Auth. Provider   ibuprofen (ADVIL) 600 MG tablet Take 1 tablet (600 mg total) by mouth every 6 (six) hours as needed. 30 tablet Bernardino Ditch, NP      PDMP not reviewed  this encounter.   Bernardino Ditch, NP 09/05/24  1348  

## 2024-09-05 NOTE — Discharge Instructions (Addendum)
 Rest your right leg is much as possible.  Avoid activities that trigger the pain.  If you exercise routinely I suggest reducing your intensity of training.  Apply ice to the affected area for 10 to 15 minutes, 2-3 times a day to help decrease pain and inflammation.  Ice for the first 48 hours and then follow icing by moist heat.  Take the 600 mg of ibuprofen every 6 hours with food to help with pain and inflammation.  Follow the home physical therapy exercises provided in your discharge instructions.  If you do not have any improvement of your symptoms in the next 5 to 7 days I recommend that you follow-up with orthopedics as you may need dedicated physical therapy or other therapeutic interventions.
